# Patient Record
Sex: Male | Born: 1975 | Hispanic: Yes | Marital: Single
Health system: Southern US, Community
[De-identification: ages and names within clinical notes are randomized; demographics above are authoritative.]

## PROBLEM LIST (undated history)

## (undated) DIAGNOSIS — I10 Essential (primary) hypertension: Secondary | ICD-10-CM

## (undated) DIAGNOSIS — E119 Type 2 diabetes mellitus without complications: Secondary | ICD-10-CM

---

## 2018-03-13 ENCOUNTER — Encounter (HOSPITAL_COMMUNITY): Payer: Self-pay | Admitting: Emergency Medicine

## 2018-03-13 ENCOUNTER — Emergency Department (HOSPITAL_COMMUNITY): Payer: Self-pay

## 2018-03-13 ENCOUNTER — Emergency Department (HOSPITAL_COMMUNITY)
Admission: EM | Admit: 2018-03-13 | Discharge: 2018-03-13 | Disposition: A | Discharge status: Home / Self Care | Payer: Self-pay | Attending: Emergency Medicine | Admitting: Emergency Medicine

## 2018-03-13 ENCOUNTER — Other Ambulatory Visit: Payer: Self-pay

## 2018-03-13 DIAGNOSIS — S0083XA Contusion of other part of head, initial encounter: Secondary | ICD-10-CM

## 2018-03-13 DIAGNOSIS — S0990XA Unspecified injury of head, initial encounter: Secondary | ICD-10-CM

## 2018-03-13 DIAGNOSIS — S0280XA Fracture of other specified skull and facial bones, unspecified side, initial encounter for closed fracture: Secondary | ICD-10-CM | POA: Insufficient documentation

## 2018-03-13 DIAGNOSIS — S060X0A Concussion without loss of consciousness, initial encounter: Secondary | ICD-10-CM | POA: Insufficient documentation

## 2018-03-13 DIAGNOSIS — S0230XA Fracture of orbital floor, unspecified side, initial encounter for closed fracture: Secondary | ICD-10-CM

## 2018-03-13 DIAGNOSIS — Y939 Activity, unspecified: Secondary | ICD-10-CM | POA: Insufficient documentation

## 2018-03-13 DIAGNOSIS — R040 Epistaxis: Secondary | ICD-10-CM | POA: Insufficient documentation

## 2018-03-13 DIAGNOSIS — E119 Type 2 diabetes mellitus without complications: Secondary | ICD-10-CM | POA: Insufficient documentation

## 2018-03-13 DIAGNOSIS — S022XXA Fracture of nasal bones, initial encounter for closed fracture: Secondary | ICD-10-CM | POA: Insufficient documentation

## 2018-03-13 DIAGNOSIS — R52 Pain, unspecified: Secondary | ICD-10-CM

## 2018-03-13 DIAGNOSIS — Y999 Unspecified external cause status: Secondary | ICD-10-CM | POA: Insufficient documentation

## 2018-03-13 DIAGNOSIS — S0240EA Zygomatic fracture, right side, initial encounter for closed fracture: Secondary | ICD-10-CM | POA: Insufficient documentation

## 2018-03-13 DIAGNOSIS — Z23 Encounter for immunization: Secondary | ICD-10-CM | POA: Insufficient documentation

## 2018-03-13 DIAGNOSIS — S0292XA Unspecified fracture of facial bones, initial encounter for closed fracture: Secondary | ICD-10-CM

## 2018-03-13 DIAGNOSIS — S02402A Zygomatic fracture, unspecified, initial encounter for closed fracture: Secondary | ICD-10-CM

## 2018-03-13 DIAGNOSIS — I10 Essential (primary) hypertension: Secondary | ICD-10-CM | POA: Insufficient documentation

## 2018-03-13 DIAGNOSIS — S0240CA Maxillary fracture, right side, initial encounter for closed fracture: Secondary | ICD-10-CM | POA: Insufficient documentation

## 2018-03-13 DIAGNOSIS — Y929 Unspecified place or not applicable: Secondary | ICD-10-CM | POA: Insufficient documentation

## 2018-03-13 DIAGNOSIS — S02401A Maxillary fracture, unspecified, initial encounter for closed fracture: Secondary | ICD-10-CM

## 2018-03-13 HISTORY — DX: Essential (primary) hypertension: I10

## 2018-03-13 HISTORY — DX: Type 2 diabetes mellitus without complications: E11.9

## 2018-03-13 LAB — I-STAT CHEM 8, ED
BUN: 14 mg/dL (ref 6–20)
Calcium, Ion: 1.22 mmol/L (ref 1.15–1.40)
Chloride: 101 mmol/L (ref 98–111)
Creatinine, Ser: 0.7 mg/dL (ref 0.61–1.24)
Glucose, Bld: 439 mg/dL — ABNORMAL HIGH (ref 70–99)
HCT: 47 % (ref 39.0–52.0)
Hemoglobin: 16 g/dL (ref 13.0–17.0)
Potassium: 3.9 mmol/L (ref 3.5–5.1)
SODIUM: 134 mmol/L — AB (ref 135–145)
TCO2: 20 mmol/L — ABNORMAL LOW (ref 22–32)

## 2018-03-13 LAB — COMPREHENSIVE METABOLIC PANEL
ALT: 38 U/L (ref 0–44)
AST: 51 U/L — ABNORMAL HIGH (ref 15–41)
Albumin: 4.7 g/dL (ref 3.5–5.0)
Alkaline Phosphatase: 88 U/L (ref 38–126)
Anion gap: 19 — ABNORMAL HIGH (ref 5–15)
BUN: 15 mg/dL (ref 6–20)
CALCIUM: 9.7 mg/dL (ref 8.9–10.3)
CO2: 17 mmol/L — ABNORMAL LOW (ref 22–32)
Chloride: 97 mmol/L — ABNORMAL LOW (ref 98–111)
Creatinine, Ser: 0.75 mg/dL (ref 0.61–1.24)
GFR calc Af Amer: >60 mL/min (ref >60)
GFR calc non Af Amer: >60 mL/min (ref >60)
Glucose, Bld: 433 mg/dL — ABNORMAL HIGH (ref 70–99)
Potassium: 3.8 mmol/L (ref 3.5–5.1)
Sodium: 133 mmol/L — ABNORMAL LOW (ref 135–145)
Total Bilirubin: 0.8 mg/dL (ref 0.3–1.2)
Total Protein: 8.4 g/dL — ABNORMAL HIGH (ref 6.5–8.1)

## 2018-03-13 LAB — CBC
HCT: 46.1 % (ref 39.0–52.0)
Hemoglobin: 16 g/dL (ref 13.0–17.0)
MCH: 31.6 pg (ref 26.0–34.0)
MCHC: 34.7 g/dL (ref 30.0–36.0)
MCV: 91.1 fL (ref 80.0–100.0)
PLATELETS: 252 10*3/uL (ref 150–400)
RBC: 5.06 MIL/uL (ref 4.22–5.81)
RDW: 11.6 % (ref 11.5–15.5)
WBC: 8.2 10*3/uL (ref 4.0–10.5)
nRBC: 0 % (ref 0.0–0.2)

## 2018-03-13 LAB — URINALYSIS, ROUTINE W REFLEX MICROSCOPIC
BACTERIA UA: NONE SEEN
Bilirubin Urine: NEGATIVE
Glucose, UA: >=500 mg/dL — AB
Ketones, ur: 20 mg/dL — AB
Leukocytes, UA: NEGATIVE
Nitrite: NEGATIVE
Protein, ur: 100 mg/dL — AB
Specific Gravity, Urine: 1.023 (ref 1.005–1.030)
pH: 5 (ref 5.0–8.0)

## 2018-03-13 LAB — ETHANOL: Alcohol, Ethyl (B): 86 mg/dL — ABNORMAL HIGH (ref <10)

## 2018-03-13 LAB — TYPE AND SCREEN
ABO/RH(D): A POS
Antibody Screen: NEGATIVE

## 2018-03-13 LAB — CBG MONITORING, ED
Glucose-Capillary: 322 mg/dL — ABNORMAL HIGH (ref 70–99)
Glucose-Capillary: 259 mg/dL — ABNORMAL HIGH (ref 70–99)

## 2018-03-13 LAB — PROTIME-INR
INR: 1.02
PROTHROMBIN TIME: 13.3 s (ref 11.4–15.2)

## 2018-03-13 LAB — ABO/RH: ABO/RH(D): A POS

## 2018-03-13 MED ORDER — TETANUS-DIPHTH-ACELL PERTUSSIS 5-2.5-18.5 LF-MCG/0.5 IM SUSP
0.5000 mL | Freq: Once | INTRAMUSCULAR | Status: AC
  Administered 2018-03-13: 0.5 mL via INTRAMUSCULAR
  Filled 2018-03-13: qty 0.5

## 2018-03-13 MED ORDER — ACETAMINOPHEN ER 650 MG PO TBCR: 650.0000 mg | EXTENDED_RELEASE_TABLET | Freq: Three times a day (TID) | ORAL | 0 refills | Status: AC | PRN

## 2018-03-13 MED ORDER — TRANEXAMIC ACID 1000 MG/10ML IV SOLN
500.0000 mg | Freq: Once | INTRAVENOUS | Status: DC
  Filled 2018-03-13: qty 10

## 2018-03-13 MED ORDER — FLUORESCEIN SODIUM 1 MG OP STRP
1.0000 | ORAL_STRIP | Freq: Once | OPHTHALMIC | Status: AC
  Administered 2018-03-13: 1 via OPHTHALMIC
  Filled 2018-03-13: qty 1

## 2018-03-13 MED ORDER — SODIUM CHLORIDE 0.9 % IV BOLUS
1000.0000 mL | Freq: Once | INTRAVENOUS | Status: AC
  Administered 2018-03-13: 1000 mL via INTRAVENOUS

## 2018-03-13 MED ORDER — CEPHALEXIN 500 MG PO CAPS: 500.0000 mg | ORAL_CAPSULE | Freq: Four times a day (QID) | ORAL | 0 refills | Status: AC

## 2018-03-13 MED ORDER — CEFAZOLIN SODIUM-DEXTROSE 1-4 GM/50ML-% IV SOLN
1.0000 g | Freq: Once | INTRAVENOUS | Status: AC
  Administered 2018-03-13: 1 g via INTRAVENOUS
  Filled 2018-03-13: qty 50

## 2018-03-13 MED ORDER — MORPHINE SULFATE (PF) 4 MG/ML IV SOLN
8.0000 mg | Freq: Once | INTRAVENOUS | Status: AC
  Administered 2018-03-13: 8 mg via INTRAVENOUS
  Filled 2018-03-13: qty 2

## 2018-03-13 MED ORDER — INSULIN ASPART PROT & ASPART (70-30 MIX) 100 UNIT/ML ~~LOC~~ SUSP
10.0000 [IU] | Freq: Once | SUBCUTANEOUS | Status: AC
  Administered 2018-03-13: 10 [IU] via SUBCUTANEOUS
  Filled 2018-03-13: qty 10

## 2018-03-13 MED ORDER — TETRACAINE HCL 0.5 % OP SOLN
2.0000 [drp] | Freq: Once | OPHTHALMIC | Status: AC
  Administered 2018-03-13: 2 [drp] via OPHTHALMIC
  Filled 2018-03-13: qty 4

## 2018-03-13 MED ORDER — HYDROCODONE-ACETAMINOPHEN 5-325 MG PO TABS: 1.0000 | ORAL_TABLET | Freq: Three times a day (TID) | ORAL | 0 refills | Status: AC | PRN

## 2018-03-13 NOTE — ED Notes (Signed)
Pt being interviewed by PD

## 2018-03-13 NOTE — ED Notes (Signed)
Between assessments he comfortably naps. He continues to arouse easily, and remains in no distress.

## 2018-03-13 NOTE — ED Notes (Signed)
He arouses easily from light sleep. He tells me he feels "better". He is oriented x 4 and in no distress. No bleeding noted from either nare.
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
## 2018-03-13 NOTE — ED Notes (Signed)
Patient transported to CT 

## 2018-03-13 NOTE — ED Notes (Signed)
Primary RN Sarah aware of previously charted vital signs.
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
## 2018-03-13 NOTE — ED Notes (Signed)
Pt aware a urine sample is needed. 
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
## 2018-03-13 NOTE — ED Provider Notes (Signed)
5:50 AM  Physical Exam  Eyes: Pupils are equal, round, and reactive to light. EOM are normal. Right conjunctiva is injected.  No proptosis or hyphema noted. Normal EOMs. Periorbital contusion noted. No uptake on fluorescein staining. No evidence of corneal abrasion or ulcer. Negative Seidel's sign. Direct photophobia without significant consensual photophobia.      Antony MaduraHumes, Kwamaine Cuppett, PA-C 03/13/18 [id number]    Derwood KaplanNanavati, Ankit, MD 03/14/18 [id number]

## 2018-03-13 NOTE — ED Notes (Signed)
Pt back from CT

## 2018-03-13 NOTE — ED Notes (Signed)
Bed: ZO10WA14 Expected date:  Expected time:  Means of arrival:  Comments: 42 y/o male ETOH FIGHT NIGHT

## 2021-02-03 ENCOUNTER — Emergency Department (HOSPITAL_COMMUNITY)
Admission: EM | Admit: 2021-02-03 | Discharge: 2021-02-03 | Disposition: A | Discharge status: Home / Self Care | Payer: Self-pay | Attending: Emergency Medicine | Admitting: Emergency Medicine

## 2021-02-03 ENCOUNTER — Emergency Department (HOSPITAL_COMMUNITY): Payer: Self-pay

## 2021-02-03 DIAGNOSIS — M25562 Pain in left knee: Secondary | ICD-10-CM | POA: Insufficient documentation

## 2021-02-03 DIAGNOSIS — R319 Hematuria, unspecified: Secondary | ICD-10-CM | POA: Insufficient documentation

## 2021-02-03 DIAGNOSIS — W19XXXA Unspecified fall, initial encounter: Secondary | ICD-10-CM

## 2021-02-03 DIAGNOSIS — W010XXA Fall on same level from slipping, tripping and stumbling without subsequent striking against object, initial encounter: Secondary | ICD-10-CM | POA: Insufficient documentation

## 2021-02-03 DIAGNOSIS — E119 Type 2 diabetes mellitus without complications: Secondary | ICD-10-CM | POA: Insufficient documentation

## 2021-02-03 DIAGNOSIS — I10 Essential (primary) hypertension: Secondary | ICD-10-CM | POA: Insufficient documentation

## 2021-02-03 DIAGNOSIS — R1030 Lower abdominal pain, unspecified: Secondary | ICD-10-CM | POA: Insufficient documentation

## 2021-02-03 LAB — COMPREHENSIVE METABOLIC PANEL
ALT: 18 U/L (ref 0–44)
AST: 18 U/L (ref 15–41)
Albumin: 3.6 g/dL (ref 3.5–5.0)
Alkaline Phosphatase: 46 U/L (ref 38–126)
Anion gap: 7 (ref 5–15)
BUN: 17 mg/dL (ref 6–20)
CO2: 26 mmol/L (ref 22–32)
Calcium: 9.4 mg/dL (ref 8.9–10.3)
Chloride: 105 mmol/L (ref 98–111)
Creatinine, Ser: 0.68 mg/dL (ref 0.61–1.24)
GFR, Estimated: >60 mL/min (ref >60)
Glucose, Bld: 125 mg/dL — ABNORMAL HIGH (ref 70–99)
Potassium: 3.8 mmol/L (ref 3.5–5.1)
Sodium: 138 mmol/L (ref 135–145)
Total Bilirubin: 1.4 mg/dL — ABNORMAL HIGH (ref 0.3–1.2)
Total Protein: 6.6 g/dL (ref 6.5–8.1)

## 2021-02-03 LAB — CBC WITH DIFFERENTIAL/PLATELET
Abs Immature Granulocytes: 0.01 10*3/uL (ref 0.00–0.07)
Basophils Absolute: 0 10*3/uL (ref 0.0–0.1)
Basophils Relative: 1 %
Eosinophils Absolute: 0.1 10*3/uL (ref 0.0–0.5)
Eosinophils Relative: 2 %
HCT: 40.3 % (ref 39.0–52.0)
Hemoglobin: 14.1 g/dL (ref 13.0–17.0)
Immature Granulocytes: 0 %
Lymphocytes Relative: 31 %
Lymphs Abs: 1.2 10*3/uL (ref 0.7–4.0)
MCH: 30.3 pg (ref 26.0–34.0)
MCHC: 35 g/dL (ref 30.0–36.0)
MCV: 86.5 fL (ref 80.0–100.0)
Monocytes Absolute: 0.4 10*3/uL (ref 0.1–1.0)
Monocytes Relative: 9 %
Neutro Abs: 2.2 10*3/uL (ref 1.7–7.7)
Neutrophils Relative %: 57 %
Platelets: 190 10*3/uL (ref 150–400)
RBC: 4.66 MIL/uL (ref 4.22–5.81)
RDW: 11.8 % (ref 11.5–15.5)
WBC: 3.9 10*3/uL — ABNORMAL LOW (ref 4.0–10.5)
nRBC: 0 % (ref 0.0–0.2)

## 2021-02-03 LAB — URINALYSIS, ROUTINE W REFLEX MICROSCOPIC
Bacteria, UA: NONE SEEN
Bilirubin Urine: NEGATIVE
Glucose, UA: 150 mg/dL — AB
Ketones, ur: 5 mg/dL — AB
Leukocytes,Ua: NEGATIVE
Nitrite: NEGATIVE
Protein, ur: 100 mg/dL — AB
Specific Gravity, Urine: 1.032 — ABNORMAL HIGH (ref 1.005–1.030)
pH: 5 (ref 5.0–8.0)

## 2021-02-03 LAB — LIPASE, BLOOD: Lipase: 64 U/L — ABNORMAL HIGH (ref 11–51)

## 2021-02-03 MED ORDER — OXYCODONE-ACETAMINOPHEN 5-325 MG PO TABS
1.0000 | ORAL_TABLET | Freq: Once | ORAL | Status: AC
  Administered 2021-02-03: 1 via ORAL
  Filled 2021-02-03: qty 1

## 2021-02-03 NOTE — ED Provider Notes (Addendum)
Emergency Medicine Provider Triage Evaluation Note  Russell Mccoy , a 45 y.o. male  was evaluated in triage.  Pt complains of left knee pain after a fall two weeks ago. 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 He was 4-5 feet off the ground. Left knee pain worsened with movement and ambulation. No numbness or tingling.   An interpretor was used during the duration of this interview.   Review of Systems  Positive: Knee pain  Negative: Numbness, tingling  Physical Exam  BP (!) 158/96 (BP Location: Right Arm)   Pulse 67   Temp 98.8 F (37.1 C) (Oral)   Resp 18   SpO2 100%  Gen:   Awake, no distress   Resp:  Normal effort  MSK:   Moves extremities without difficulty  Other:  Pulse intact. Compartments soft. No obvious deformities. Patient ambulatory both in and out of room.   Medical Decision Making  Medically screening exam initiated at 11:13 AM.  Appropriate orders placed.  Russell Mccoy was informed that the remainder of the evaluation will be completed by another provider, this initial triage assessment does not replace that evaluation, and the importance of remaining in the ED until their evaluation is complete.  Knee XR.   Achille Rich, PA-C 02/03/21 1116    Achille Rich, PA-C 02/03/21 1117    Jacalyn Lefevre, MD 02/03/21 1231

## 2021-02-03 NOTE — Discharge Instructions (Addendum)
Please follow up with Dr. [facility] for further evaluation of your knee pain.  Use the crutches and knee immobilizer until you can be seen  by them for comfort.  While at home please rest, ice, and elevate your knee to help with pain/inflammation. You can take Ibuprofen and Tylenol as needed for pain.   Please take Ibuprofen (Advil, motrin) and Tylenol (acetaminophen) to relieve your pain.    You may take up to 600 MG (3 pills) of normal strength ibuprofen every 8 hours as needed.   You make take tylenol, up to 1,000 mg (two extra strength pills) every 8 hours as needed.   It is safe to take ibuprofen and tylenol at the same time as they work differently.   Do not take more than 3,000 mg tylenol in a 24 hour period (not more than one dose every 8 hours.  Please check all medication labels as many medications such as pain and cold medications may contain tylenol.  Do not drink alcohol while taking these medications.  Do not take other NSAID'S while taking ibuprofen (such as aleve or naproxen).  Please take ibuprofen with food to decrease stomach upset.

## 2021-02-03 NOTE — ED Provider Notes (Signed)
[facility] EMERGENCY DEPARTMENT Provider Note   CSN: [id number] Arrival date & time: 02/03/21  1038     History Chief Complaint  Patient presents with   Fall    Russell Mccoy is a 45 y.o. male with PMHx HTN and Diabetes who presents to the ED today with complaint of gradual onset, constant, achy, L knee pain s/p fall that occurred 2 weeks ago. Pt reports that he tripped and fell at home landing onto his L knee and has been having pain since that time. He has been taking Tylenol and Ibuprofen for the pain without relief. He has been able to ambulate however reports pain with same.   Pt also complains of achy lower abdomina pain for the past 3-4 days. He states he is unsure if it is related to his knee pain but wanted to bring it up during the visit today. He denies fevers, chills, nausea, vomiting, diarrhea, constipation, urinary symptoms, testicular pain. He denies any recent sick contacts. No previous abdominal surgeries.   The history is provided by the patient and medical records. The history is limited by a language barrier. A language interpreter was used.      Past Medical History:  Diagnosis Date   Diabetes mellitus without complication (HCC)    Hypertension     There are no problems to display for this patient.   No past surgical history on file.     No family history on file.  Social History   Substance Use Topics   Alcohol use: Yes    Home Medications Prior to Admission medications   Medication Sig Start Date End Date Taking? Authorizing Provider  acetaminophen (TYLENOL 8 HOUR) 650 MG CR tablet Take 1 tablet (650 mg total) by mouth every 8 (eight) hours as needed. 03/13/18   Derwood Kaplan, MD  cephALEXin (KEFLEX) 500 MG capsule Take 1 capsule (500 mg total) by mouth 4 (four) times daily. 03/13/18   Derwood Kaplan, MD    Allergies    Patient has no known allergies.  Review of Systems   Review of Systems  Constitutional:   Negative for chills and fever.  Gastrointestinal:  Positive for abdominal pain. Negative for constipation, diarrhea and nausea.  Genitourinary:  Negative for dysuria, flank pain, frequency and testicular pain.  Musculoskeletal:  Positive for arthralgias.  All other systems reviewed and are negative.  Physical Exam Updated Vital Signs BP (!) 158/96 (BP Location: Right Arm)   Pulse 67   Temp 98.8 F (37.1 C) (Oral)   Resp 18   SpO2 100%   Physical Exam Vitals and nursing note reviewed.  Constitutional:      Appearance: He is not ill-appearing or diaphoretic.  HENT:     Head: Normocephalic and atraumatic.  Eyes:     Conjunctiva/sclera: Conjunctivae normal.  Cardiovascular:     Rate and Rhythm: Normal rate and regular rhythm.     Pulses: Normal pulses.  Pulmonary:     Effort: Pulmonary effort is normal.     Breath sounds: Normal breath sounds. No wheezing, rhonchi or rales.  Abdominal:     Palpations: Abdomen is soft.     Tenderness: There is abdominal tenderness. There is no guarding or rebound.     Comments: Soft, diffuse lower abdominal TTP, +BS throughout, no r/g/r, neg murphy's, neg mcburney's, no CVA TTP  Musculoskeletal:     Cervical back: Neck supple.     Comments: No obvious swelling to L knee compared to R.  No ecchymosis. No erythema or increased warmth. + TTP to anterior aspect of knee. ROM intact however crepitus appreciated with range. Negative anterior and posterior drawer test. No varus or valgus laxity. 2+ PT pulse.   Skin:    General: Skin is warm and dry.  Neurological:     Mental Status: He is alert.    ED Results / Procedures / Treatments   Labs (all labs ordered are listed, but only abnormal results are displayed) Labs Reviewed  COMPREHENSIVE METABOLIC PANEL  CBC WITH DIFFERENTIAL/PLATELET  LIPASE, BLOOD  URINALYSIS, ROUTINE W REFLEX MICROSCOPIC    EKG None  Radiology DG Knee Complete 4 Views Left  Result Date: 02/03/2021 CLINICAL DATA:  A  45 year old male presents with knee pain with swelling and bruising. EXAM: LEFT KNEE - COMPLETE 4+ VIEW COMPARISON:  None FINDINGS: No evidence of fracture, dislocation, or joint effusion. No evidence of arthropathy or other focal bone abnormality. Soft tissues are unremarkable. IMPRESSION: Negative evaluation of the LEFT knee. Electronically Signed   By: Donzetta Kohut M.D.   On: 02/03/2021 11:44    Procedures Procedures   Medications Ordered in ED Medications  oxyCODONE-acetaminophen (PERCOCET/ROXICET) 5-325 MG per tablet 1 tablet (has no administration in time range)    ED Course  I have reviewed the triage vital signs and the nursing notes.  Pertinent labs & imaging results that were available during my care of the patient were reviewed by me and considered in my medical decision making (see chart for details).    MDM Rules/Calculators/A&P                           45 year old Spanish-speaking male who presents to the ED today with complaint of consistent left knee pain status post fall directly onto knee 2 weeks ago.  Also incidentally complaining of lower abdominal pain for the past 3 to 4 days.  Patient did not mention this during triage.  On arrival to the ED today vitals are stable.  Patient had an x-ray done of his knee which did not show any acute findings.  On my exam he is some mild tenderness palpation to the anterior aspect of the knee.  He is neurovascularly intact.  There is no obvious ligamentous injury.  He does have some crepitus with range of motion.  We will plan for knee immobilizer and crutches and Ortho follow-up.  He is also noted to have some mild lower abdominal tenderness palpation diffusely.  He denies any associated symptoms.  We will plan for labs and reevaluation.  I have lower suspicion for acute abdomen at this time.   CBC with leukopenia 3.9. Hgb stable at 14.1 CMP with glucose 125. T bili slightly elevated at 1.4. Remainder of LFTs unremarkable.  Lipase  64. Not consistent with pancreatitis at this time.  U/A with moderate hgb on dipstick and 21-50 RBCs per HPF. Given abdominal pain and hematuria will proceed with CT Renal stone study for further eval.   At shift change case signed out to Francine Graven, PA-C, who will dispo patient accordingly.   This note was prepared using Dragon voice recognition software and may include unintentional dictation errors due to the inherent limitations of voice recognition software.   Final Clinical Impression(s) / ED Diagnoses Final diagnoses:  None    Rx / DC Orders ED Discharge Orders     None        Tanda Rockers, PA-C 02/03/21 1553  Rolan Bucco, MD 02/04/21 [phone]

## 2021-02-03 NOTE — ED Provider Notes (Signed)
  Care of patient assumed from PA PA [place] at [phone].  Agree with history, physical exam and plan.  See their note for further details. Briefly, 45 year old man with history of hypertension and diabetes who presents the emergency department with chief complaint of left knee pain that started after a fall 2 weeks prior.  Patient also complains of achy left lower abdominal pain over the last 3 to 4 days.  Patient denies any fevers, chills, nausea, vomiting, diarrhea, constipation, dysuria, hematuria, urinary urgency, testicular pain.  Physical Exam  BP (!) 142/89 (BP Location: Left Arm)   Pulse 63   Temp 98.8 F (37.1 C) (Oral)   Resp 20   SpO2 98%   Physical Exam Vitals and nursing note reviewed.  Constitutional:      General: He is not in acute distress.    Appearance: He is not ill-appearing, toxic-appearing or diaphoretic.  HENT:     Head: Normocephalic.  Eyes:     General: No scleral icterus.       Right eye: No discharge.        Left eye: No discharge.  Cardiovascular:     Rate and Rhythm: Normal rate.  Pulmonary:     Effort: Pulmonary effort is normal.  Abdominal:     General: There is no distension. There are no signs of injury.     Palpations: Abdomen is soft. There is no mass or pulsatile mass.     Tenderness: There is abdominal tenderness in the right lower quadrant and left lower quadrant. There is no guarding or rebound.     Hernia: There is no hernia in the umbilical area or ventral area.     Comments: Abdomen soft, nondistended, minimal tenderness to bilateral lower quadrants.  Skin:    General: Skin is warm and dry.  Neurological:     General: No focal deficit present.     Mental Status: He is alert.     GCS: GCS eye subscore is 4. GCS verbal subscore is 5. GCS motor subscore is 6.  Psychiatric:        Behavior: Behavior is cooperative.    ED Course/Procedures     Procedures  MDM   X-ray imaging obtained by previous provider showed no acute osseous  abnormality.  Patient was placed in knee immobilizer given crutches.  Patient will follow-up with orthopedic provider for further their evaluation.  At time of handoff CT renal study pending as urinalysis showed hematuria.  CT renal study shows mild wall thickening of urinary bladder without perivesicular stranding.  Mild gallbladder distention without signs of edema adjacent stranding.  Small fat-containing umbilical hernia.  Normal appendix.  Low suspicion for urinary tract infection as patient is having no urinary symptoms, as well as urinalysis showing no bacteria, leukocyte negative, nitrite negative, WBC 0-5.  Patient was informed of CT results.  Patient was concerned about his ability hernia.  We will give patient information to follow-up with general surgery if he has pain or discomfort related to his small fat-containing umbilical hernia.  Discussed results, findings, treatment and follow up. Patient advised of return precautions. Patient verbalized understanding and agreed with plan.  Spanish interpreter was used to conduct this interview.     Haskel Schroeder, PA-C 02/03/21 1657    Rolan Bucco, MD 02/04/21 [phone]

## 2021-02-03 NOTE — ED Triage Notes (Signed)
Pt here POV with c/o of a fall., Pt endorses left knee pain. Ambulatory to triage room

## 2021-02-03 NOTE — ED Notes (Signed)
Patient verbalizes understanding of discharge instructions. Follow-up care reviewed. Opportunity for questioning and answers were provided. Armband removed by staff, pt discharged from ED with crutches.

## 2021-02-05 ENCOUNTER — Ambulatory Visit (INDEPENDENT_AMBULATORY_CARE_PROVIDER_SITE_OTHER): Payer: Self-pay | Admitting: Physician Assistant

## 2021-02-05 ENCOUNTER — Encounter: Payer: Self-pay | Admitting: Physician Assistant

## 2021-02-05 ENCOUNTER — Other Ambulatory Visit: Payer: Self-pay

## 2021-02-05 DIAGNOSIS — M25561 Pain in right knee: Secondary | ICD-10-CM

## 2021-02-05 MED ORDER — METHYLPREDNISOLONE ACETATE 40 MG/ML IJ SUSP
40.0000 mg | INTRAMUSCULAR | Status: AC | PRN
  Administered 2021-02-05: 40 mg via INTRA_ARTICULAR

## 2021-02-05 MED ORDER — LIDOCAINE HCL 1 % IJ SOLN
3.0000 mL | INTRAMUSCULAR | Status: AC | PRN
  Administered 2021-02-05: 3 mL

## 2021-02-05 NOTE — Progress Notes (Signed)
Office Visit Note   Patient: Russell Mccoy           Date of Birth: 08-Jul-1975           MRN: [id number] Visit Date: 02/05/2021              Requested by: No referring provider defined for this encounter. PCP: Patient, No Pcp Per (Inactive)   Assessment & Plan: Visit Diagnoses:  1. Acute pain of right knee     Plan: Explained to the patient through interpreter that given his poorly controlled diabetes would not recommend steroid injection in the knee.  He did tolerate the aspiration of the knee today well.  Given hemarthrosis of knee and his normal radiographs recommend MRI of the left knee to rule out internal derangement/meniscal tear..  Having follow-up after this to go over the findings and discuss further treatment.  Follow-Up Instructions: Return Ater MRI.   Orders:  Orders Placed This Encounter  Procedures   Large Joint Inj   MR Knee Right w/o contrast   No orders of the defined types were placed in this encounter.     Procedures: Large Joint Inj: L knee on 02/05/2021 4:55 PM Indications: pain Details: 22 G 1.5 in needle, superolateral approach  Arthrogram: No  Medications: 3 mL lidocaine 1 %; 40 mg methylPREDNISolone acetate 40 MG/ML Aspirate: 15 mL bloody Outcome: tolerated well, no immediate complications Procedure, treatment alternatives, risks and benefits explained, specific risks discussed. Consent was given by the patient. Immediately prior to procedure a time out was called to verify the correct patient, procedure, equipment, support staff and site/side marked as required. Patient was prepped and draped in the usual sterile fashion.      Clinical Data: No additional findings.   Subjective: Chief Complaint  Patient presents with   Left Knee - Pain    HPI Patient is 45 year old male were seen  for the first time for left knee pain.  He was seen in the ER on 02/03/2021.  He reports that he was at work and fell off of a work truck.  The fall occurred weeks before he was seen in the ER.  He states his pain has been unchanged.  He has some painful popping in the knee but otherwise no mechanical symptoms.  Slight swelling.  Taken naproxen and Tylenol.  Radiographs of his left knee personally reviewed on epic and knee showed no acute fractures no bony abnormalities.  Knee is well located.  He presents today in a knee immobilizer.  He speaks Spanish and requires an interpreter this was done using [employer] video.  Review of Systems  Constitutional:  Negative for chills and fever.    Objective: Vital Signs: There were no vitals taken for this visit.  Physical Exam Constitutional:      Appearance: He is not ill-appearing or diaphoretic.  Pulmonary:     Effort: Pulmonary effort is normal.  Neurological:     Mental Status: He is alert and oriented to person, place, and time.  Psychiatric:  Mood and Affect: Mood normal.    Ortho Exam Left knee has good range of motion.  Tenderness over the medial joint line.  Positive McMurray's.  Anterior drawer is negative.  Slight effusion no abnormal warmth or erythema.  No instability valgus varus stressing.  Right knee full range of motion without pain.  Bilateral legs he is able to do leg lift. Specialty Comments:  No specialty comments available.  Imaging: No results found.   PMFS History: There are no problems to display for this patient.  Past Medical History:  Diagnosis Date   Diabetes mellitus without complication (HCC)    Hypertension     History reviewed. No pertinent family history.  History reviewed. No pertinent surgical history. Social History   Occupational History   Not on file  Tobacco Use   Smoking status: Not on file   Smokeless tobacco: Not on file  Substance and Sexual Activity   Alcohol use: Yes   Drug use:  Not on file   Sexual activity: Not on file

## 2021-02-20 ENCOUNTER — Telehealth: Payer: Self-pay | Admitting: Orthopaedic Surgery

## 2021-02-20 NOTE — Telephone Encounter (Signed)
left vm for pt to return call and sch post MRI after 03/12/21 with CB or GC

## 2021-03-12 ENCOUNTER — Ambulatory Visit
Admission: RE | Admit: 2021-03-12 | Discharge: 2021-03-12 | Disposition: A | Discharge status: Home / Self Care | Payer: Self-pay | Source: Ambulatory Visit | Attending: Physician Assistant | Admitting: Physician Assistant

## 2021-03-12 ENCOUNTER — Other Ambulatory Visit: Payer: Self-pay | Admitting: Physician Assistant

## 2021-03-12 DIAGNOSIS — M25561 Pain in right knee: Secondary | ICD-10-CM

## 2021-03-14 ENCOUNTER — Telehealth: Payer: Self-pay | Admitting: Physician Assistant

## 2021-03-14 NOTE — Telephone Encounter (Signed)
Left vm for pt to return call to  schedule post MRI after 03/12/21 with GC or CB

## 2021-03-22 ENCOUNTER — Ambulatory Visit (INDEPENDENT_AMBULATORY_CARE_PROVIDER_SITE_OTHER): Payer: Self-pay | Admitting: Physician Assistant

## 2021-03-22 ENCOUNTER — Other Ambulatory Visit: Payer: Self-pay

## 2021-03-22 ENCOUNTER — Telehealth: Payer: Self-pay

## 2021-03-22 ENCOUNTER — Encounter: Payer: Self-pay | Admitting: Physician Assistant

## 2021-03-22 DIAGNOSIS — S83232D Complex tear of medial meniscus, current injury, left knee, subsequent encounter: Secondary | ICD-10-CM

## 2021-03-22 NOTE — Telephone Encounter (Signed)
Russell Mccoy pt needs surgery but needs cost of surgery typed up for him to take to his employer. Can you please get this for me?

## 2021-03-22 NOTE — Telephone Encounter (Signed)
Pt decided he wants to pick it up

## 2021-03-22 NOTE — Progress Notes (Signed)
HPI: Mr. Russell Mccoy returns today to go over the MRI of his left knee.  He continues to have significant pain in the left knee and notes that he is having giving way.  Interpreter through [employer] is used today. MRI left knee dated 03/13/2021 shows a complex tear of the posterior horn medial meniscus extending into the posterior body.  The medial meniscus is flipped peripherally into the inferior gutter.  Trochlear groove and medial compartment with partial cartilage thickness loss.  Actual images are reviewed with the patient today in the knee model shown.  Impression: Right knee acute medial meniscal tear  Plan: Discussed with patient findings at length.  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  He states he had no pain in the knee prior to his injury when he fell off of a work truck on 02/03/2021.  His employer is for patient going to pick up the cost of his medical bills.  He is asking for a copy of the encouraged cost thus far and how much surgery would cost.  He would like to proceed with surgery in the near future this would be a right knee arthroscopy with partial medial meniscectomy.  Risk benefits surgery discussed.  Postoperative protocol discussed with patient.  Risk include but are not limited to DVT/PE, wound healing problems, infection, nerve vessel injury and prolonged pain or worsening pain.  Questions were encouraged and answered by Dr. Magnus Ivan and myself.  We will work on getting him scheduled for surgery will provide him with the information he is asked for.

## 2021-03-22 NOTE — Telephone Encounter (Signed)
I do not have a surgery sheet.

## 2021-03-22 NOTE — Telephone Encounter (Signed)
Pt wants wants a copy of his cost so far mailed to him

## 2021-03-22 NOTE — Telephone Encounter (Signed)
Can you please give her a sheet then send back to sherrie please

## 2021-03-30 ENCOUNTER — Telehealth: Payer: Self-pay | Admitting: Orthopaedic Surgery

## 2021-03-30 NOTE — Telephone Encounter (Signed)
Called patient using [employer], Interpreter stated that someone picked up the line but did not say anything.  Will try again later.

## 2021-03-30 NOTE — Telephone Encounter (Signed)
Pt would also like to know how much the surgery will cost; and they will need a spanish interpreter.   [phone]

## 2021-03-30 NOTE — Telephone Encounter (Signed)
Pt came n statiung he was supposed to be scheduled for surgery but hadn't heard anything. Pt would like a CB with  the date and time please.  [phone]

## 2021-04-12 ENCOUNTER — Telehealth: Payer: Self-pay | Admitting: Radiology

## 2021-04-12 NOTE — Telephone Encounter (Signed)
Patient called to speak to someone about scheduling surgery.

## 2021-04-17 ENCOUNTER — Telehealth: Payer: Self-pay

## 2021-04-17 NOTE — Telephone Encounter (Signed)
Patient called and would like to schedule surgery. Let me know when you are ready to schedule. He speaks spanish. I can help you call him.   CB  [phone]

## 2021-04-26 NOTE — Telephone Encounter (Signed)
Marisue Ivan called patient with me and left voice mail for him to call back.

## 2021-04-30 NOTE — Telephone Encounter (Signed)
Spoke with patient on Friday and scheduled surgery. ?

## 2021-05-14 NOTE — Progress Notes (Signed)
Chart reviewed as part of pre op phone call. Pt with uncontrolled  DM,, multiple ED visits c/o chest pain. Pt is not a candidate for outpatient surgery per guidelines. Spoke with Sherri at Dr [facility] office, will move to main OR.

## 2021-05-18 ENCOUNTER — Telehealth: Payer: Self-pay | Admitting: Physician Assistant

## 2021-05-18 ENCOUNTER — Telehealth: Payer: Self-pay

## 2021-05-18 NOTE — Telephone Encounter (Signed)
Pt called. He states that he was suppose to have an appt on 2/09. He is wanting to know what is going on with his surgery. I had to call interpreter to help translate.   CB [phone]

## 2021-05-18 NOTE — Telephone Encounter (Signed)
I called the office and confirmed patient is seeing Jorge Ny, NP on 05/21/21.  I faxed clearance request indicating uncontrolled diabetes and chest pain episode was also a concern and needs to be addressed before patient can have surgery.

## 2021-05-18 NOTE — Telephone Encounter (Signed)
Using [employer], I called patient and advised needs uncontrolled diabetes and chest pain episode also to be addressed before patient can have surgery.  He was unsure of PCP and was advised to call me back with that information.  In the meantime, see other message from today.  Patient has follow up with Jorge Ny, NP and clearance was sent noting these concerns before patient can have surgery.

## 2021-05-18 NOTE — Telephone Encounter (Signed)
[facility] called [phone] this pt has an appt with the PCP on Monday and will fill out a surgical clearance at the time of his appt for his BP. If you could fax the clearance to [phone] they will address during appt

## 2021-05-24 ENCOUNTER — Encounter (HOSPITAL_BASED_OUTPATIENT_CLINIC_OR_DEPARTMENT_OTHER): Payer: Self-pay

## 2021-05-24 ENCOUNTER — Ambulatory Visit (HOSPITAL_BASED_OUTPATIENT_CLINIC_OR_DEPARTMENT_OTHER): Admit: 2021-05-24 | Payer: Self-pay | Admitting: Orthopaedic Surgery

## 2021-05-24 SURGERY — ARTHROSCOPY, KNEE
Anesthesia: Choice | Site: Knee | Laterality: Left

## 2021-05-31 ENCOUNTER — Encounter: Payer: Self-pay | Admitting: Physician Assistant

## 2021-09-12 ENCOUNTER — Other Ambulatory Visit: Payer: Self-pay

## 2021-09-12 ENCOUNTER — Emergency Department (HOSPITAL_COMMUNITY)
Admission: EM | Admit: 2021-09-12 | Discharge: 2021-09-12 | Disposition: A | Discharge status: Home / Self Care | Payer: Self-pay | Attending: Emergency Medicine | Admitting: Emergency Medicine

## 2021-09-12 ENCOUNTER — Emergency Department (HOSPITAL_COMMUNITY): Payer: Self-pay

## 2021-09-12 DIAGNOSIS — I10 Essential (primary) hypertension: Secondary | ICD-10-CM | POA: Insufficient documentation

## 2021-09-12 DIAGNOSIS — R1084 Generalized abdominal pain: Secondary | ICD-10-CM | POA: Insufficient documentation

## 2021-09-12 DIAGNOSIS — E119 Type 2 diabetes mellitus without complications: Secondary | ICD-10-CM | POA: Insufficient documentation

## 2021-09-12 DIAGNOSIS — R109 Unspecified abdominal pain: Secondary | ICD-10-CM

## 2021-09-12 DIAGNOSIS — E039 Hypothyroidism, unspecified: Secondary | ICD-10-CM | POA: Insufficient documentation

## 2021-09-12 LAB — COMPREHENSIVE METABOLIC PANEL
ALT: 17 U/L (ref 0–44)
AST: 18 U/L (ref 15–41)
Albumin: 3.7 g/dL (ref 3.5–5.0)
Alkaline Phosphatase: 50 U/L (ref 38–126)
Anion gap: 5 (ref 5–15)
BUN: 12 mg/dL (ref 6–20)
CO2: 25 mmol/L (ref 22–32)
Calcium: 9.2 mg/dL (ref 8.9–10.3)
Chloride: 106 mmol/L (ref 98–111)
Creatinine, Ser: 0.88 mg/dL (ref 0.61–1.24)
GFR, Estimated: >60 mL/min (ref >60)
Glucose, Bld: 211 mg/dL — ABNORMAL HIGH (ref 70–99)
Potassium: 4 mmol/L (ref 3.5–5.1)
Sodium: 136 mmol/L (ref 135–145)
Total Bilirubin: 1.1 mg/dL (ref 0.3–1.2)
Total Protein: 6.6 g/dL (ref 6.5–8.1)

## 2021-09-12 LAB — CBC WITH DIFFERENTIAL/PLATELET
Abs Immature Granulocytes: 0.01 10*3/uL (ref 0.00–0.07)
Basophils Absolute: 0 10*3/uL (ref 0.0–0.1)
Basophils Relative: 1 %
Eosinophils Absolute: 0.1 10*3/uL (ref 0.0–0.5)
Eosinophils Relative: 2 %
HCT: 40.5 % (ref 39.0–52.0)
Hemoglobin: 14 g/dL (ref 13.0–17.0)
Immature Granulocytes: 0 %
Lymphocytes Relative: 35 %
Lymphs Abs: 1.5 10*3/uL (ref 0.7–4.0)
MCH: 29 pg (ref 26.0–34.0)
MCHC: 34.6 g/dL (ref 30.0–36.0)
MCV: 83.9 fL (ref 80.0–100.0)
Monocytes Absolute: 0.4 10*3/uL (ref 0.1–1.0)
Monocytes Relative: 10 %
Neutro Abs: 2.3 10*3/uL (ref 1.7–7.7)
Neutrophils Relative %: 52 %
Platelets: 208 10*3/uL (ref 150–400)
RBC: 4.83 MIL/uL (ref 4.22–5.81)
RDW: 12.6 % (ref 11.5–15.5)
WBC: 4.3 10*3/uL (ref 4.0–10.5)
nRBC: 0 % (ref 0.0–0.2)

## 2021-09-12 LAB — URINALYSIS, ROUTINE W REFLEX MICROSCOPIC
Bacteria, UA: NONE SEEN
Bilirubin Urine: NEGATIVE
Glucose, UA: 50 mg/dL — AB
Ketones, ur: NEGATIVE mg/dL
Leukocytes,Ua: NEGATIVE
Nitrite: NEGATIVE
Protein, ur: 30 mg/dL — AB
Specific Gravity, Urine: 1.008 (ref 1.005–1.030)
pH: 5 (ref 5.0–8.0)

## 2021-09-12 LAB — LIPASE, BLOOD: Lipase: 41 U/L (ref 11–51)

## 2021-09-12 LAB — TROPONIN I (HIGH SENSITIVITY): Troponin I (High Sensitivity): 8 ng/L (ref <18)

## 2021-09-12 MED ORDER — OXYCODONE-ACETAMINOPHEN 5-325 MG PO TABS
1.0000 | ORAL_TABLET | Freq: Once | ORAL | Status: AC
  Administered 2021-09-12: 1 via ORAL
  Filled 2021-09-12: qty 1

## 2021-09-12 MED ORDER — ALUM & MAG HYDROXIDE-SIMETH 200-200-20 MG/5ML PO SUSP
30.0000 mL | Freq: Once | ORAL | Status: AC
  Administered 2021-09-12: 30 mL via ORAL
  Filled 2021-09-12: qty 30

## 2021-09-12 MED ORDER — FAMOTIDINE 20 MG PO TABS: 20.0000 mg | ORAL_TABLET | Freq: Two times a day (BID) | ORAL | 0 refills | Status: AC

## 2021-09-12 MED ORDER — LIDOCAINE VISCOUS HCL 2 % MT SOLN
15.0000 mL | Freq: Once | OROMUCOSAL | Status: AC
  Administered 2021-09-12: 15 mL via ORAL
  Filled 2021-09-12: qty 15

## 2021-09-12 MED ORDER — PANTOPRAZOLE SODIUM 20 MG PO TBEC: 20.0000 mg | DELAYED_RELEASE_TABLET | Freq: Every day | ORAL | 0 refills | Status: AC

## 2021-09-12 MED ORDER — ONDANSETRON 4 MG PO TBDP
4.0000 mg | ORAL_TABLET | Freq: Once | ORAL | Status: AC
  Administered 2021-09-12: 4 mg via ORAL
  Filled 2021-09-12: qty 1

## 2021-09-12 MED ORDER — SUCRALFATE 1 G PO TABS
1.0000 g | ORAL_TABLET | Freq: Three times a day (TID) | ORAL | 1 refills | Status: AC
Start: 2021-09-12 — End: ?

## 2021-09-12 NOTE — Discharge Instructions (Addendum)
Please read and follow all provided instructions.  Your diagnoses today include:  1. Flank pain   2. Generalized abdominal pain     Tests performed today include: Blood cell counts and platelets: Normal infection fighting and red blood cell counts Kidney and liver function tests: Your blood sugar was a little high, but otherwise your tests were normal Pancreas function test (called lipase): Was normal Urine test to look for infection: Shows blood in the urine but no infection today A blood test to look for signs of stress on the heart: No sign of heart attack or other problems Chest x-ray: Was normal Vital signs. See below for your results today.   Medications prescribed:  Pantoprazole - stomach acid reducer  Carafate - for stomach upset and to protect your stomach  Pepcid (famotidine) - antihistamine  You can find this medication over-the-counter.   DO NOT exceed:  20mg  Pepcid every 12 hours  Take any prescribed medications only as directed.  Home care instructions:  Follow any educational materials contained in this packet.  Follow-up instructions: Please follow-up with your primary care provider in the next 2 days for further evaluation of your symptoms.  You may also call the gastroenterologist referral to schedule a follow-up appointment.  Return instructions:  SEEK IMMEDIATE MEDICAL ATTENTION IF: The pain does not go away or becomes severe  A temperature above 101F develops  Repeated vomiting occurs (multiple episodes)  The pain becomes localized to portions of the abdomen. The right side could possibly be appendicitis. In an adult, the left lower portion of the abdomen could be colitis or diverticulitis.  Blood is being passed in stools or vomit (bright red or black tarry stools)  You develop chest pain, difficulty breathing, dizziness or fainting, or become confused, poorly responsive, or inconsolable (young children) If you have any other emergent concerns  regarding your health  Additional Information: Abdominal (belly) pain can be caused by many things. Your caregiver performed an examination and possibly ordered blood/urine tests and imaging (CT scan, x-rays, ultrasound). Many cases can be observed and treated at home after initial evaluation in the emergency department. Even though you are being discharged home, abdominal pain can be unpredictable. Therefore, you need a repeated exam if your pain does not resolve, returns, or worsens. Most patients with abdominal pain don't have to be admitted to the hospital or have surgery, but serious problems like appendicitis and gallbladder attacks can start out as nonspecific pain. Many abdominal conditions cannot be diagnosed in one visit, so follow-up evaluations are very important.  Your vital signs today were: BP (!) 154/96   Pulse 64   Temp 98.3 F (36.8 C)   Resp 19   SpO2 97%  If your blood pressure (bp) was elevated above 135/85 this visit, please have this repeated by your doctor within one month. --------------

## 2021-09-12 NOTE — ED Triage Notes (Signed)
Pt here for L side abd pain w/ nausea and had intermittent chest pain and testicular pain. Pt is diabetic, and recently had a UTI that he was treated for.

## 2021-09-12 NOTE — ED Provider Notes (Signed)
[facility] EMERGENCY DEPARTMENT Provider Note   CSN: [id number] Arrival date & time: 09/12/21  1408     History  Chief Complaint  Patient presents with   Abdominal Pain    Russell Mccoy is a 46 y.o. male.  Patient with microhematuria, diabetes, high cholesterol, hypertension, hypothyroid, no past surgical history with a history of presents -- presents to the emergency department for evaluation of left-sided abdominal pain and flank pain over the past 4 to 5 days.  He has some pain that radiates into his chest at times.  He has had associated nausea and vomiting.  He states that he feels like he has to have a bowel movement but cannot.  Denies diarrhea.  He has had dysuria and states that he has seen a urologist about hematuria.  No fevers.  No cough or shortness of breath reported.  Patient was seen at [facility] emergency department on 09/07/21 for same complaint.  Lab work was overall unremarkable.  He had a CT renal protocol which demonstrated circumferential bladder wall thickening, no signs of kidney stone or other intra-abdominal problems, did show diverticulosis without diverticulitis and coronary artery calcifications.  Patient states that he was prescribed an antibiotic which she has completed.  This did not help his symptoms.  His pain is worsened when he eats and drinks.  He reports having a lot of heartburn.  He denies heavy NSAID use but does drink alcohol, approximately 3 days/week.  Spanish video interpreter utilized during interview.      Home Medications Prior to Admission medications   Medication Sig Start Date End Date Taking? Authorizing Provider  acetaminophen (TYLENOL 8 HOUR) 650 MG CR tablet Take 1 tablet (650 mg total) by mouth every 8 (eight) hours as needed. 03/13/18   Derwood Kaplan, MD  cephALEXin (KEFLEX) 500 MG capsule Take 1 capsule (500 mg total) by mouth 4 (four) times daily. 03/13/18   Derwood Kaplan, MD      Allergies     Patient has no known allergies.    Review of Systems   Review of Systems  Physical Exam Updated Vital Signs BP (!) 161/82   Pulse 70   Temp 98.3 F (36.8 C)   Resp 16   SpO2 98%   Physical Exam Vitals and nursing note reviewed.  Constitutional:      General: He is not in acute distress.    Appearance: He is well-developed.  HENT:     Head: Normocephalic and atraumatic.  Eyes:     General:        Right eye: No discharge.        Left eye: No discharge.     Conjunctiva/sclera: Conjunctivae normal.  Cardiovascular:     Rate and Rhythm: Normal rate and regular rhythm.     Heart sounds: Normal heart sounds.  Pulmonary:     Effort: Pulmonary effort is normal.     Breath sounds: Normal breath sounds.  Abdominal:     Palpations: Abdomen is soft.     Tenderness: There is generalized abdominal tenderness and tenderness in the left upper quadrant and left lower quadrant. There is no guarding or rebound. Negative signs include Murphy's sign, Rovsing's sign and McBurney's sign.  Musculoskeletal:     Cervical back: Normal range of motion and neck supple.  Skin:    General: Skin is warm and dry.  Neurological:     Mental Status: He is alert.    ED Results / Procedures / Treatments  Labs (all labs ordered are listed, but only abnormal results are displayed) Labs Reviewed  COMPREHENSIVE METABOLIC PANEL - Abnormal; Notable for the following components:      Result Value   Glucose, Bld 211 (*)    All other components within normal limits  URINALYSIS, ROUTINE W REFLEX MICROSCOPIC - Abnormal; Notable for the following components:   Glucose, UA 50 (*)    Hgb urine dipstick MODERATE (*)    Protein, ur 30 (*)    All other components within normal limits  CBC WITH DIFFERENTIAL/PLATELET  LIPASE, BLOOD  TROPONIN I (HIGH SENSITIVITY)    ED ECG REPORT   Date: 09/12/2021  Rate: 68  Rhythm: normal sinus rhythm  QRS Axis: normal  Intervals: normal  ST/T Wave abnormalities:  nonspecific T wave changes  Conduction Disutrbances:none  Narrative Interpretation:   Old EKG Reviewed: none available  I have personally reviewed the EKG tracing and agree with the computerized printout as noted.   Radiology DG Chest 2 View  Result Date: 09/12/2021 CLINICAL DATA:  Chest pain. EXAM: CHEST - 2 VIEW COMPARISON:  Frontal chest film on 03/13/2018 FINDINGS: The heart size and mediastinal contours are within normal limits. There is no evidence of pulmonary edema, consolidation, pneumothorax, nodule or pleural fluid. The visualized skeletal structures are unremarkable. IMPRESSION: No active cardiopulmonary disease. Electronically Signed   By: Irish LackGlenn  Yamagata M.D.   On: 09/12/2021 15:40    Procedures Procedures    Medications Ordered in ED Medications  alum & mag hydroxide-simeth (MAALOX/MYLANTA) 200-200-20 MG/5ML suspension 30 mL (has no administration in time range)    And  lidocaine (XYLOCAINE) 2 % viscous mouth solution 15 mL (has no administration in time range)  ondansetron (ZOFRAN-ODT) disintegrating tablet 4 mg (has no administration in time range)    ED Course/ Medical Decision Making/ A&P    Patient seen and examined. History obtained directly from patient using Spanish video interpreter.  I have also reviewed patient's external ED evaluation from 5/26 including lab work, UA, CT.  Labs/EKG: Ordered CBC, CMP, lipase, troponin, UA.   Imaging: Ordered chest x-ray.  Considered CT abdomen pelvis, however he recently had this done 5 days ago and will not repeat unless something is drastically changed as far as lab work.  Medications/Fluids: Ordered: GI cocktail.   Most recent vital signs reviewed and are as follows: BP (!) 161/82   Pulse 70   Temp 98.3 F (36.8 C)   Resp 16   SpO2 98%   Initial impression: Nonspecific abdominal pain.   7:02 PM Reassessment performed. Patient appears comfortable.  He said the GI cocktail did not help.  Abdomen without rebound  or guarding on reexam.  Labs personally reviewed and interpreted including: CBC unremarkable with normal white and red blood cell count; CMP with normal electrolytes, glucose  elevated at 211, normal creatinine and transaminases; lipase normal; urine without white blood cells today, moderate hemoglobin which is unchanged.  Imaging personally visualized and interpreted including: Chest x-ray agree negative  Reviewed pertinent lab work and imaging with patient at bedside. Questions answered.   Most current vital signs reviewed and are as follows: BP (!) 154/96   Pulse 64   Temp 98.3 F (36.8 C)   Resp 19   SpO2 97%   Plan: Discharge to home.   Prescriptions written for: Protonix, Pepcid, Carafate  Other home care instructions discussed: Avoidance of activities or foods that make the symptoms worse.  ED return instructions discussed: The patient was urged to  return to the Emergency Department immediately with worsening of current symptoms, worsening abdominal pain, persistent vomiting, blood noted in stools, fever, or any other concerns. The patient verbalized understanding.   Follow-up instructions discussed: Patient encouraged to follow-up with their PCP in 2 days.  I did provide contact information for [facility] gastroenterology as well.                              Medical Decision Making Risk OTC drugs. Prescription drug management.  For this patient's complaint of abdominal pain, the following conditions were considered on the differential diagnosis: gastritis/PUD, enteritis/duodenitis, appendicitis, cholelithiasis/cholecystitis, cholangitis, pancreatitis, ruptured viscus, colitis, diverticulitis, proctitis, cystitis, pyelonephritis, ureteral colic, aortic dissection, aortic aneurysm. In women, ectopic pregnancy, pelvic inflammatory disease, ovarian cysts, and tubo-ovarian abscess were also considered. Atypical chest etiologies were also considered including ACS, PE, and  pneumonia.   The patient's vital signs, pertinent lab work and imaging were reviewed and interpreted as discussed in the ED course. Hospitalization was considered for further testing, treatments, or serial exams/observation. However as patient is well-appearing, has a stable exam, and reassuring studies today, I do not feel that they warrant admission at this time. This plan was discussed with the patient who verbalizes agreement and comfort with this plan and seems reliable and able to return to the Emergency Department with worsening or changing symptoms.        Final Clinical Impression(s) / ED Diagnoses Final diagnoses:  Flank pain  Generalized abdominal pain    Rx / DC Orders ED Discharge Orders          Ordered    pantoprazole (PROTONIX) 20 MG tablet  Daily        09/12/21 1859    famotidine (PEPCID) 20 MG tablet  2 times daily        09/12/21 1859    sucralfate (CARAFATE) 1 g tablet  3 times daily with meals & bedtime        09/12/21 1859              Renne Crigler, PA-C 09/12/21 1905    Ernie Avena, MD 09/12/21 2055

## 2021-09-12 NOTE — ED Provider Triage Note (Cosign Needed Addendum)
Emergency Medicine Provider Triage Evaluation Note  Russell Mccoy , a 46 y.o. male  was evaluated in triage.  Pt complains of generalized abdominal pain, nausea, chest pain, testicular pain.  Patient states that he has had abdominal pain which began to the left side 3 days ago.  He has had nausea alongside the pain.  He states for the past 2 days he has had intermittent chest pain that was generalized in nature.  No radiation of symptoms.  He also complains of 2 days of intermittent testicular pain.  Patient states that he does have a history of a hernia but is unable to tell me the type of hernia.  Denies shortness of breath  Upon brief chart review noted that patient was seen on May 26 at outside emergency department for left-sided flank pain and had CT findings concerning for possible cystitis/pyelonephritis.  Also had greater than 1000 glucose in urine.  Patient at the time was noncompliant with antihypertensives as well.  Patient was evaluated by urology due to hematuria and was put on antibiotics.  Patient states he has finished those antibiotics.  Denies dysuria/hematuria today  Review of Systems  Positive: Abdominal pain, nausea, chest pain, testicular pain Negative: Shortness of breath  Physical Exam  BP (!) 158/93 (BP Location: Right Arm)   Pulse 74   Temp 98.3 F (36.8 C)   Resp 16   SpO2 98%  Gen:   Awake, no distress   Resp:  Normal effort  MSK:   Moves extremities without difficulty  Other:    Medical Decision Making  Medically screening exam initiated at 2:54 PM.  Appropriate orders placed.  Russell Mccoy was informed that the remainder of the evaluation will be completed by another provider, this initial triage assessment does not replace that evaluation, and the importance of remaining in the ED until their evaluation is complete.     Darrick Grinder, PA-C 09/12/21 1458    Darrick Grinder, PA-C 09/12/21 1500

## 2022-01-14 ENCOUNTER — Ambulatory Visit (INDEPENDENT_AMBULATORY_CARE_PROVIDER_SITE_OTHER): Payer: Self-pay | Admitting: Physician Assistant

## 2022-01-14 ENCOUNTER — Ambulatory Visit (INDEPENDENT_AMBULATORY_CARE_PROVIDER_SITE_OTHER): Payer: Self-pay

## 2022-01-14 ENCOUNTER — Encounter: Payer: Self-pay | Admitting: Physician Assistant

## 2022-01-14 DIAGNOSIS — S83232D Complex tear of medial meniscus, current injury, left knee, subsequent encounter: Secondary | ICD-10-CM

## 2022-01-14 NOTE — Progress Notes (Signed)
Office Visit Note   Patient: Russell Mccoy           Date of Birth: 11/22/1975           MRN: [id number] Visit Date: 01/14/2022              Requested by: No referring provider defined for this encounter. PCP: Patient, No Pcp Per   Assessment & Plan: Visit Diagnoses:  1. Complex tear of medial meniscus of left knee as current injury, subsequent encounter     Plan: Given patient's poorly controlled diabetes and the fact that he had some type of ongoing pulmonary infection he needs to follow-up with his primary care doctor.  Did recommend he try a knee brace due to the fact knee is giving way on him.  However the knee brace did not seem to help therefore he is given an Ace bandage to apply some pressure in the knee hopefully this will help with some of the pain.  Regards to the knee he needs to undergo a left knee arthroscopy given his complex tear of the posterior horn medial meniscus and continued mechanical symptoms.  However he is not a surgical candidate at this time as outlined above.  We will see him back in 1 month to see how things are coming along in regards to his pulmonary infection and his uncontrolled diabetes.  Questions were encouraged and answered today using interpreter.  Follow-Up Instructions: Return in about 4 weeks (around 02/11/2022).   Orders:  Orders Placed This Encounter  Procedures   XR Knee 1-2 Views Left   No orders of the defined types were placed in this encounter.     Procedures: No procedures performed   Clinical Data: No additional findings.   Subjective: Chief Complaint  Patient presents with   Left Knee - Pain    HPI patient returns today with an interpreter stating that his left knee is still hurting.  He is getting way on him causing him to fall.  Recently had injury to the knee when he fell 3 days ago.  He again he had an MRI of his knee back in December was recommended due to a meniscal tear that he undergo knee  arthroscopy.  However due to chest pain and poorly controlled diabetes he was never scheduled.  He states that he was found to have a lung infection and is due to see his primary care physician tomorrow to see how he is progressing with the lung infection.  He states he still has a cough.  His glucose levels were 426 just 2 weeks ago.  Review of Systems See HPI otherwise negative or noncontributory  Objective: Vital Signs: There were no vitals taken for this visit.  Physical Exam General: Well-developed well-nourished male no acute distress mood affect appropriate.  Ambulates without any assistive device Ortho Exam Left knee full extension full flexion tenderness along medial joint line.  No abnormal warmth or erythema. Specialty Comments:  No specialty comments available.  Imaging: XR Knee 1-2 Views Left  Result Date: 01/14/2022 Left knee 2 views: No acute fractures or acute findings.  Knee is well located.  No bony abnormalities otherwise.  No soft tissue abnormalities.    PMFS History: There are no problems to display for this patient.  Past Medical History:  Diagnosis Date   Diabetes mellitus without complication ([place])    Hypertension     History reviewed. No pertinent family history.  History reviewed. No pertinent surgical history.  Social History   Occupational History   Not on file  Tobacco Use   Smoking status: Not on file   Smokeless tobacco: Not on file  Substance and Sexual Activity   Alcohol use: Yes   Drug use: Not on file   Sexual activity: Not on file

## 2022-07-04 ENCOUNTER — Other Ambulatory Visit: Payer: Self-pay | Admitting: Student

## 2023-01-13 IMAGING — MR MR KNEE*L* W/O CM
4 of 6 series · 24 of 40 positions shown · non-contrast
Comparison: None.

CLINICAL DATA: Left knee pain status post fall. Injured 2 months
ago.

EXAM:
MRI OF THE LEFT KNEE WITHOUT CONTRAST
TECHNIQUE: Multiplanar, multisequence MR imaging of the knee was performed. No
intravenous contrast was administered.

[Series 3: T2 fat-sat · axial · 4.0mm · 0.62mm/px · z∈[-57,+83]mm · 8 of 29 slices shown (1 of 2)]
[im 1/29]
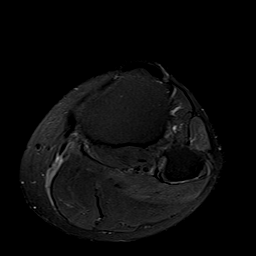
[im 5/29]
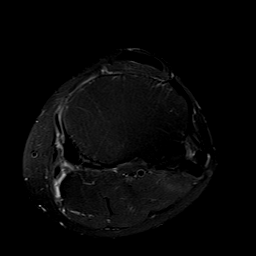
[im 9/29]
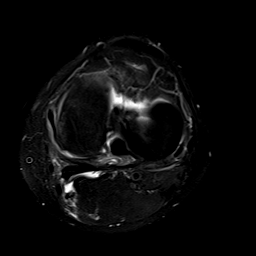
[im 13/29]
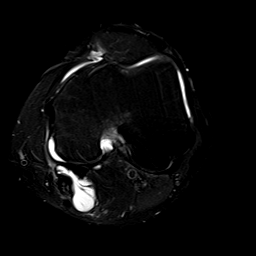
[im 17/29]
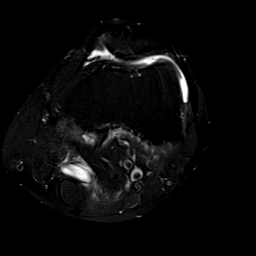
[im 21/29]
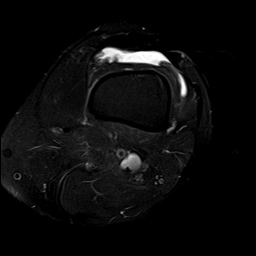
[im 25/29]
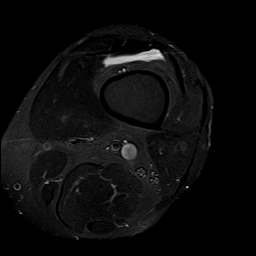
[im 29/29]
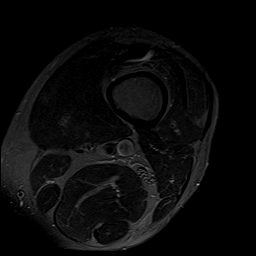

[Series 5: T2 fat-sat · coronal · 4.0mm · 0.29mm/px · 3 of 27 slices shown (2 of 2)]
[im 6/27]
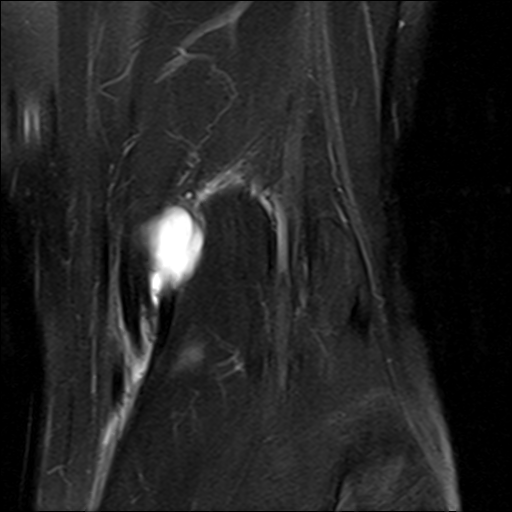
[im 16/27]
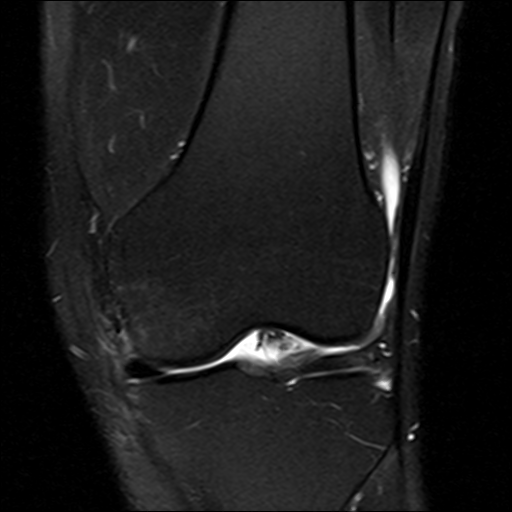
[im 27/27]
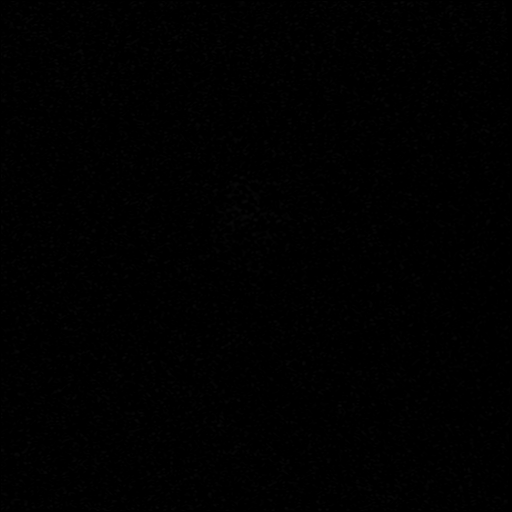

[Series 6: PD fat-sat · coronal · 4.0mm · 0.29mm/px · 6 of 27 slices shown (1 of 2)]
[im 1/27]
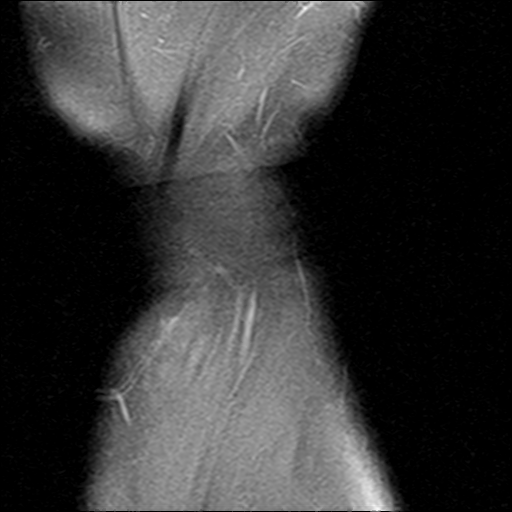
[im 6/27]
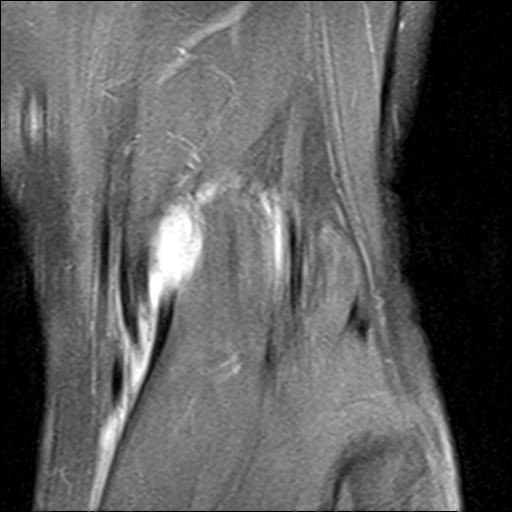
[im 11/27]
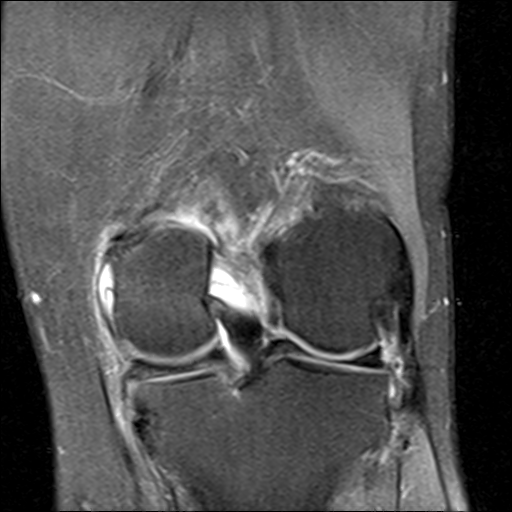
[im 16/27]
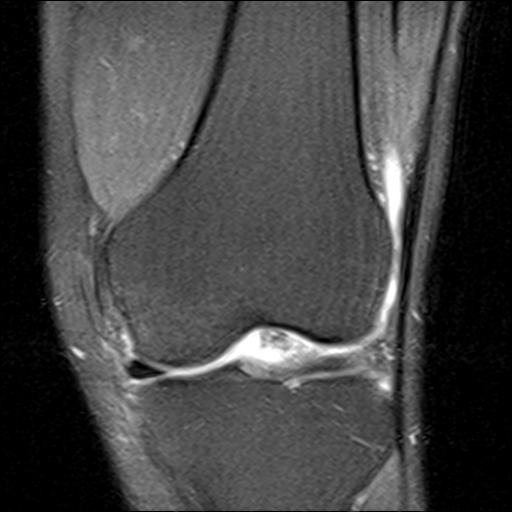
[im 21/27]
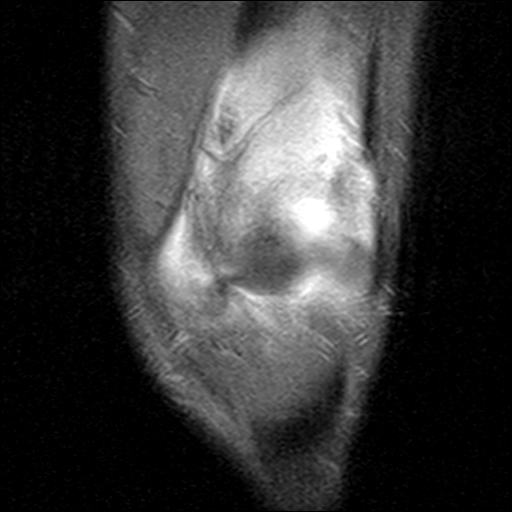
[im 27/27]
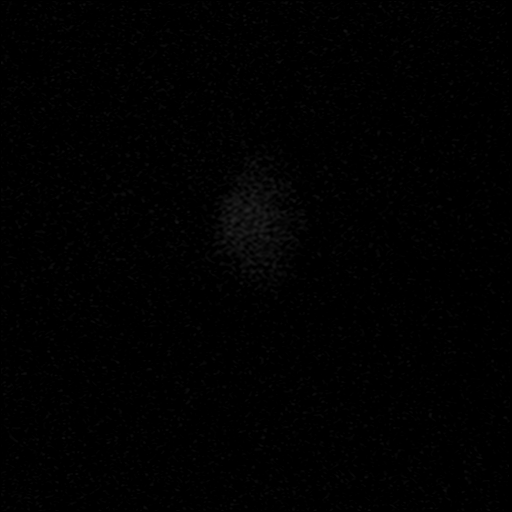

[Series 8: PD fat-sat · sagittal · 3.0mm · 0.29mm/px · 7 of 31 slices shown (2 of 2)]
[im 1/31]
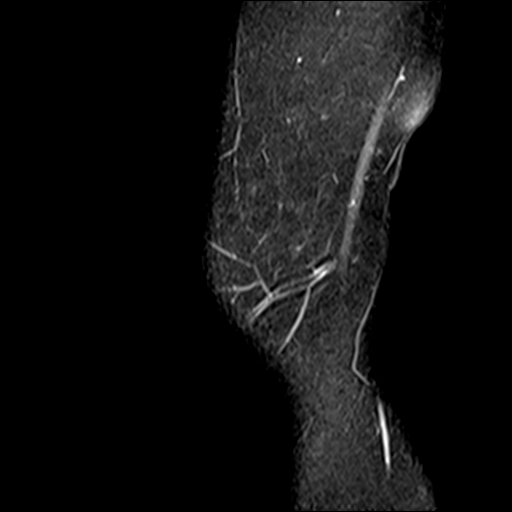
[im 6/31]
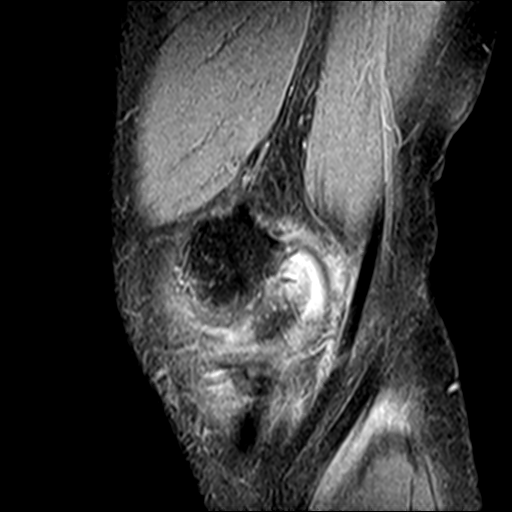
[im 11/31]
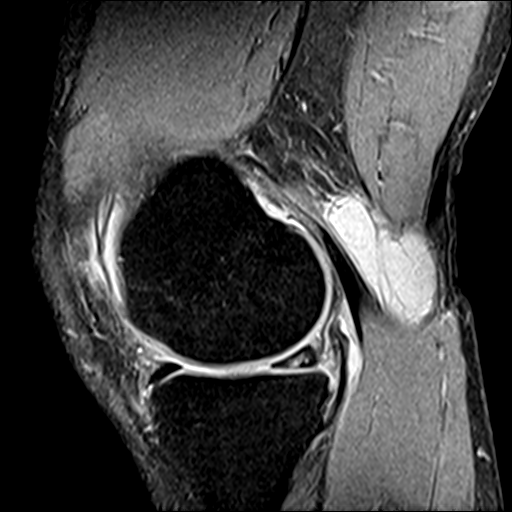
[im 16/31]
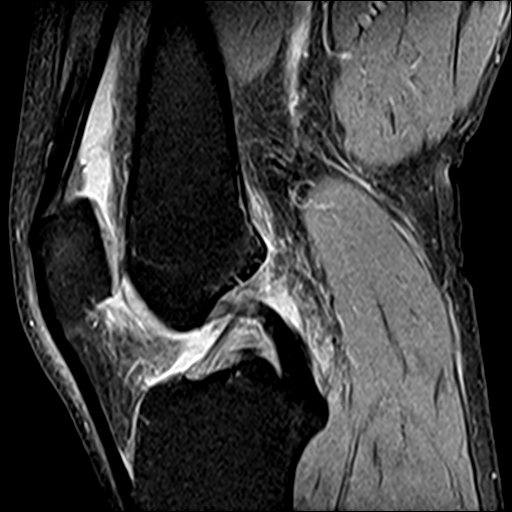
[im 21/31]
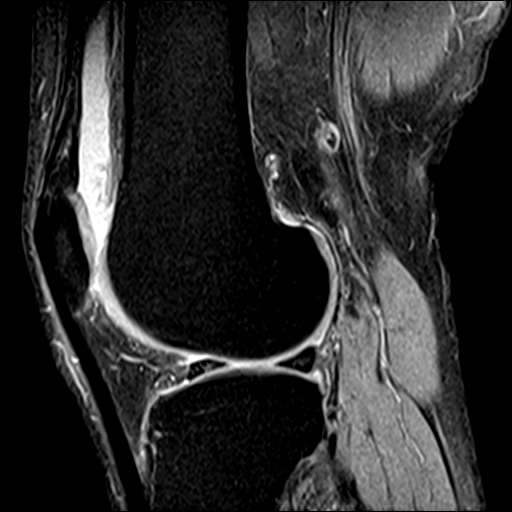
[im 26/31]
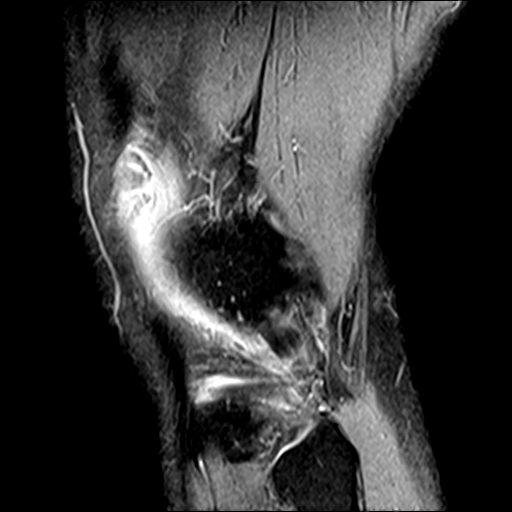
[im 31/31]
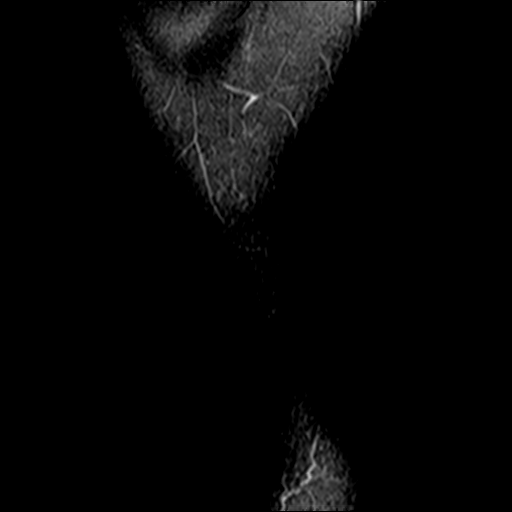

[24 of 40 positions shown; findings below may reference images not displayed]

FINDINGS: MENISCI

Medial: Complex tear of the posterior horn of the medial meniscus
extending into the posterior body. Flap of meniscal tissue from the
undersurface of the posterior body of the medial meniscus is flipped
peripherally into the inferior gutter.

Lateral: Intact.

LIGAMENTS

Cruciates: ACL and PCL are intact.

Collaterals: Medial collateral ligament is intact. Lateral
collateral ligament complex is intact.

CARTILAGE

Patellofemoral: Partial-thickness cartilage loss of the trochlear
groove.

Medial: Partial-thickness cartilage loss of the medial femorotibial
compartment.

Lateral:  No chondral defect.

JOINT: Moderate joint effusion. Normal Megharaj Saru. No plical
thickening.

POPLITEAL FOSSA: Popliteus tendon is intact. Small Baker's cyst.
Small amount of fluid superficial to the medial gastrocnemius muscle
likely reflecting a leaking Baker's cyst.

EXTENSOR MECHANISM: Intact quadriceps tendon. Intact patellar
tendon. Intact lateral patellar retinaculum. Intact medial patellar
retinaculum. Intact MPFL.

BONES: No aggressive osseous lesion. No fracture or dislocation.

Other: No fluid collection or hematoma. Muscles are normal.
IMPRESSION: 1. Complex tear of the posterior horn of the medial meniscus
extending into the posterior body. Flap of meniscal tissue from the
undersurface of the posterior body of the medial meniscus is flipped
peripherally into the inferior gutter.
2. Partial-thickness cartilage loss of the trochlear groove.
3. Partial-thickness cartilage loss of the medial femorotibial
compartment.
